# Patient Record
Sex: Male | Born: 1980 | Race: Black or African American | Marital: Married | Smoking: Never smoker
Health system: Southern US, Community
[De-identification: ages and names within clinical notes are randomized; demographics above are authoritative.]

---

## 2016-08-23 ENCOUNTER — Ambulatory Visit
Admission: RE | Admit: 2016-08-23 | Discharge: 2016-08-23 | Disposition: A | Discharge status: Home / Self Care | Payer: No Typology Code available for payment source | Source: Ambulatory Visit | Attending: Occupational Medicine | Admitting: Occupational Medicine

## 2016-08-23 ENCOUNTER — Other Ambulatory Visit: Payer: Self-pay | Admitting: Occupational Medicine

## 2016-08-23 DIAGNOSIS — Z021 Encounter for pre-employment examination: Secondary | ICD-10-CM

## 2017-06-24 IMAGING — CR DG CHEST 1V
1 series · 1 of 1 positions shown · non-contrast
Comparison: None.

CLINICAL DATA: Pre-employment nonsmoker

EXAM:
CHEST 1 VIEW

[w chest pa]
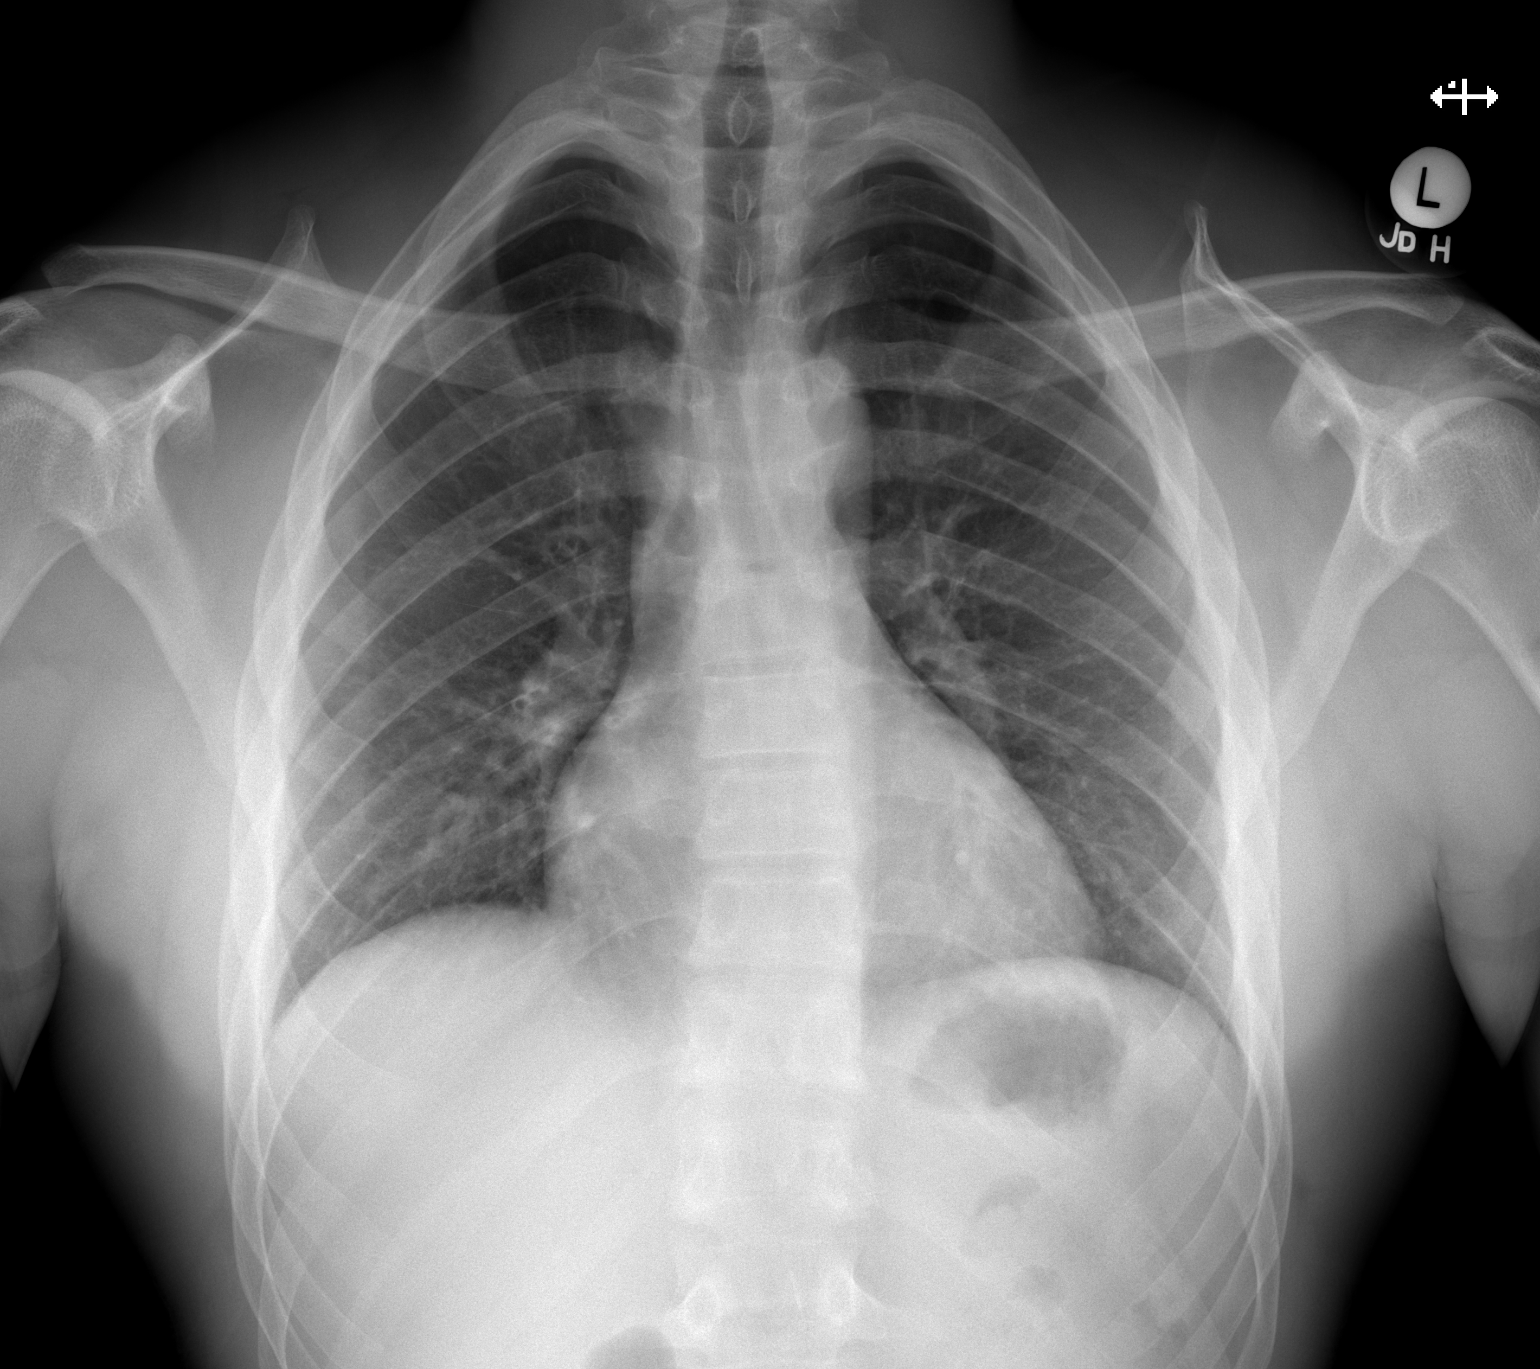

[1 of 1 positions shown; findings below may reference images not displayed]

FINDINGS: The heart size and mediastinal contours are within normal limits.
Both lungs are clear. The visualized skeletal structures are
unremarkable.
IMPRESSION: No active disease.

## 2018-04-10 DIAGNOSIS — R509 Fever, unspecified: Secondary | ICD-10-CM | POA: Diagnosis not present

## 2018-05-19 DIAGNOSIS — M79676 Pain in unspecified toe(s): Secondary | ICD-10-CM | POA: Diagnosis not present

## 2018-05-19 DIAGNOSIS — Z0001 Encounter for general adult medical examination with abnormal findings: Secondary | ICD-10-CM | POA: Diagnosis not present

## 2018-05-19 DIAGNOSIS — Z0000 Encounter for general adult medical examination without abnormal findings: Secondary | ICD-10-CM | POA: Diagnosis not present

## 2018-09-30 ENCOUNTER — Encounter (HOSPITAL_COMMUNITY): Payer: Self-pay | Admitting: Emergency Medicine

## 2018-09-30 ENCOUNTER — Emergency Department (HOSPITAL_COMMUNITY)
Admission: EM | Admit: 2018-09-30 | Discharge: 2018-09-30 | Disposition: A | Discharge status: Home / Self Care | Payer: No Typology Code available for payment source | Attending: Emergency Medicine | Admitting: Emergency Medicine

## 2018-09-30 DIAGNOSIS — Z7721 Contact with and (suspected) exposure to potentially hazardous body fluids: Secondary | ICD-10-CM | POA: Diagnosis present

## 2018-09-30 NOTE — ED Triage Notes (Signed)
Pt works with [employer], was exposed to possible blood/spit/vomit with a CPR that was brought in to our ED.

## 2018-09-30 NOTE — ED Provider Notes (Signed)
[facility] EMERGENCY DEPARTMENT Provider Note   CSN: [id number] Arrival date & time: 09/30/18  0138     History   Chief Complaint Chief Complaint  Patient presents with  . Body Fluid Exposure    HPI Jose Mcdaniel is a 37 y.o. male.   Body Fluid Exposure  Type of exposure:  Body fluid Exposure substance: blood   Exposure location:  Head/neck and face Head/neck exposure location:  Head Facial exposure location:  Face, chin and nose Time since exposure:  2 hours Context:  Patient care Tetanus immunization status:  Up to date Known source: yes   Source HIV status:  Unknown Source hepatitis B status:  Unknown Source hepatitis C status:  Unknown STD concern: no     History reviewed. No pertinent past medical history.  There are no active problems to display for this patient.   History reviewed. No pertinent surgical history.      Home Medications    Prior to Admission medications   Not on File    Family History No family history on file.  Social History Social History   Tobacco Use  . Smoking status: Never Smoker  . Smokeless tobacco: Never Used  Substance Use Topics  . Alcohol use: Yes    Comment: Socially   . Drug use: Not Currently     Allergies   Patient has no known allergies.   Review of Systems Review of Systems  All other systems reviewed and are negative.    Physical Exam Updated Vital Signs BP 132/89 (BP Location: Right Arm)   Pulse (!) 52   Temp 98.4 F (36.9 C) (Oral)   Resp 16   Ht 5\' 11"  (1.803 m)   Wt 86.2 kg   SpO2 95%   BMI 26.50 kg/m   Physical Exam Vitals signs and nursing note reviewed.  Constitutional:      Appearance: He is well-developed.  HENT:     Head: Normocephalic and atraumatic.     Right Ear: Tympanic membrane normal.     Left Ear: Tympanic membrane normal.     Nose: Nose normal.     Mouth/Throat:     Mouth: Mucous membranes are moist.  Eyes:     Extraocular Movements:  Extraocular movements intact.     Pupils: Pupils are equal, round, and reactive to light.  Neck:     Musculoskeletal: Normal range of motion.  Cardiovascular:     Rate and Rhythm: Normal rate.  Pulmonary:     Effort: Pulmonary effort is normal. No respiratory distress.  Abdominal:     General: There is no distension.  Musculoskeletal: Normal range of motion.  Skin:    General: Skin is warm and dry.  Neurological:     General: No focal deficit present.     Mental Status: He is alert.      ED Treatments / Results  Labs (all labs ordered are listed, but only abnormal results are displayed) Labs Reviewed - No data to display  EKG None  Radiology No results found.  Procedures Procedures (including critical care time)  Medications Ordered in ED Medications - No data to display   Initial Impression / Assessment and Plan / ED Course  I have reviewed the triage vital signs and the nursing notes.  Pertinent labs & imaging results that were available during my care of the patient were reviewed by me and considered in my medical decision making (see chart for details).  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 No known medical problems. Labs drawn from other patient. HIV negative. Hepatitis pending. Charge nurse will follow up.   Final Clinical Impressions(s) / ED Diagnoses   Final diagnoses:  None    ED Discharge Orders    None       Aliza Moret, Barbara CowerJason, MD 09/30/18 2132

## 2023-09-09 ENCOUNTER — Ambulatory Visit (HOSPITAL_BASED_OUTPATIENT_CLINIC_OR_DEPARTMENT_OTHER)
Admission: RE | Admit: 2023-09-09 | Discharge: 2023-09-09 | Disposition: A | Discharge status: Home / Self Care | Payer: 59 | Source: Ambulatory Visit | Attending: Internal Medicine | Admitting: Internal Medicine

## 2023-09-09 ENCOUNTER — Encounter (HOSPITAL_BASED_OUTPATIENT_CLINIC_OR_DEPARTMENT_OTHER): Payer: Self-pay | Admitting: Emergency Medicine

## 2023-09-09 ENCOUNTER — Ambulatory Visit (HOSPITAL_BASED_OUTPATIENT_CLINIC_OR_DEPARTMENT_OTHER)
Admission: EM | Admit: 2023-09-09 | Discharge: 2023-09-09 | Disposition: A | Discharge status: Home / Self Care | Payer: 59 | Attending: Internal Medicine | Admitting: Internal Medicine

## 2023-09-09 DIAGNOSIS — R059 Cough, unspecified: Secondary | ICD-10-CM | POA: Insufficient documentation

## 2023-09-09 DIAGNOSIS — J209 Acute bronchitis, unspecified: Secondary | ICD-10-CM | POA: Diagnosis not present

## 2023-09-09 MED ORDER — PREDNISONE 20 MG PO TABS: 40.0000 mg | ORAL_TABLET | Freq: Every day | ORAL | 0 refills | Status: AC

## 2023-09-09 MED ORDER — BENZONATATE 100 MG PO CAPS: 100.0000 mg | ORAL_CAPSULE | Freq: Three times a day (TID) | ORAL | 0 refills | Status: AC

## 2023-09-09 MED ORDER — PROMETHAZINE-DM 6.25-15 MG/5ML PO SYRP: 5.0000 mL | ORAL_SOLUTION | Freq: Every evening | ORAL | 0 refills | Status: AC | PRN

## 2023-09-09 NOTE — ED Triage Notes (Signed)
Pt had cough that is productive since Sunday. Pt taking Mucinex and other OTC meds. Ann Maki being treated for PNA. Reports when lays down cough is worse. Reports that hears a rattle when exhales.

## 2023-09-09 NOTE — ED Provider Notes (Signed)
[facility]    CSN: [id number] Arrival date & time: 09/09/23  0817      History   Chief Complaint Chief Complaint  Patient presents with   Cough    HPI Jose Mcdaniel is a 42 y.o. male.   Jose Mcdaniel is a 42 y.o. male presenting for chief complaint of Cough that started 5 days ago.  Cough is productive with colored sputum and is worse at nighttime.  Reports initial nasal congestion, sore throat, and bodyaches but the symptoms have improved.  Denies shortness of breath reports bilateral chest discomfort associated with cough and states he feels as though there is something in his chest that needs to come out if he were able to cough hard enough. His son was recently treated for atypical pneumonia. Denies nausea, vomiting, diarrhea, abdominal pain, rash, and recent fevers. Denies history of chronic respiratory problems. Never smoker. Taking OTC medications with minimal relief.    Cough   History reviewed. No pertinent past medical history.  There are no problems to display for this patient.   History reviewed. No pertinent surgical history.     Home Medications    Prior to Admission medications   Medication Sig Start Date End Date Taking? Authorizing Provider  benzonatate (TESSALON) 100 MG capsule Take 1 capsule (100 mg total) by mouth every 8 (eight) hours. 09/09/23  Yes Carlisle Beers, FNP  predniSONE (DELTASONE) 20 MG tablet Take 2 tablets (40 mg total) by mouth daily with breakfast for 5 days. 09/09/23 09/14/23 Yes Carlisle Beers, FNP  promethazine-dextromethorphan (PROMETHAZINE-DM) 6.25-15 MG/5ML syrup Take 5 mLs by mouth at bedtime as needed for cough. 09/09/23  Yes Carlisle Beers, FNP    Family History No family history on file.  Social History Social History   Tobacco Use   Smoking status: Never   Smokeless tobacco: Never  Substance Use Topics   Alcohol use: Yes    Comment: Socially    Drug use: Not Currently      Allergies   Patient has no known allergies.   Review of Systems Review of Systems  Respiratory:  Positive for cough.   Per HPI   Physical Exam Triage Vital Signs ED Triage Vitals [09/09/23 0832]  Encounter Vitals Group     BP (!) 132/90     Systolic BP Percentile      Diastolic BP Percentile      Pulse Rate 88     Resp 16     Temp 98.5 F (36.9 C)     Temp Source Oral     SpO2 95 %     Weight      Height      Head Circumference      Peak Flow      Pain Score 0     Pain Loc      Pain Education      Exclude from Growth Chart    No data found.  Updated Vital Signs BP (!) 132/90 (BP Location: Right Arm)   Pulse 88   Temp 98.5 F (36.9 C) (Oral)   Resp 16   SpO2 95%   Visual Acuity Right Eye Distance:   Left Eye Distance:   Bilateral Distance:    Right Eye Near:   Left Eye Near:    Bilateral Near:     Physical Exam Vitals and nursing note reviewed.  Constitutional:      Appearance: He is not ill-appearing or toxic-appearing.  HENT:  Head: Normocephalic and atraumatic.     Right Ear: Hearing, tympanic membrane, ear canal and external ear normal.     Left Ear: Hearing, tympanic membrane, ear canal and external ear normal.     Nose: Nose normal.     Mouth/Throat:     Lips: Pink.     Mouth: Mucous membranes are moist. No injury.     Tongue: No lesions. Tongue does not deviate from midline.     Palate: No mass and lesions.     Pharynx: Oropharynx is clear. Uvula midline. No pharyngeal swelling, oropharyngeal exudate, posterior oropharyngeal erythema or uvula swelling.     Tonsils: No tonsillar exudate or tonsillar abscesses.  Eyes:     General: Lids are normal. Vision grossly intact. Gaze aligned appropriately.     Extraocular Movements: Extraocular movements intact.     Conjunctiva/sclera: Conjunctivae normal.  Cardiovascular:     Rate and Rhythm: Normal rate and regular rhythm.     Heart sounds: Normal heart sounds, S1 normal and S2 normal.   Pulmonary:     Effort: Pulmonary effort is normal. No respiratory distress.     Breath sounds: Normal breath sounds and air entry. No wheezing, rhonchi or rales.     Comments: Course breath sounds throughout.  Chest:     Chest wall: No tenderness.  Musculoskeletal:     Cervical back: Neck supple.     Right lower leg: No edema.     Left lower leg: No edema.  Lymphadenopathy:     Cervical: Cervical adenopathy present.  Skin:    General: Skin is warm and dry.     Capillary Refill: Capillary refill takes less than 2 seconds.     Findings: No rash.  Neurological:     General: No focal deficit present.     Mental Status: He is alert and oriented to person, place, and time. Mental status is at baseline.     Cranial Nerves: No dysarthria or facial asymmetry.  Psychiatric:        Mood and Affect: Mood normal.        Speech: Speech normal.        Behavior: Behavior normal.        Thought Content: Thought content normal.        Judgment: Judgment normal.      UC Treatments / Results  Labs (all labs ordered are listed, but only abnormal results are displayed) Labs Reviewed - No data to display  EKG   Radiology No results found.  Procedures Procedures (including critical care time)  Medications Ordered in UC Medications - No data to display  Initial Impression / Assessment and Plan / UC Course  I have reviewed the triage vital signs and the nursing notes.  Pertinent labs & imaging results that were available during my care of the patient were reviewed by me and considered in my medical decision making (see chart for details).   1. Acute bronchitis Presentation suspicious for acute bronchitis. Will treat with prednisone burst and cough medications for symptomatic relief.  Deferred viral testing based on timing of illness. Chest x-ray ordered outpatient to rule out focal consolidation/pneumonia given recent exposure to sick contact. Staff will call patient if chest x-ray  shows findings that would change treatment plan. Otherwise, patient to follow-up with PCP in 3-5 days as needed.  Counseled patient on potential for adverse effects with medications prescribed/recommended today, strict ER and return-to-clinic precautions discussed, patient verbalized understanding.    Final Clinical  Impressions(s) / UC Diagnoses   Final diagnoses:  Acute bronchitis, unspecified organism     Discharge Instructions      Please go to [facility] for your chest x-ray today.  Their address is: [address] [place]  I will call you with the results of the x-ray if the x-ray shows any abnormalities or need for change in current treatment plan. If the x-ray is negative, you will not hear from me and I would like for you to continue with current treatment plan.  We will go ahead and treat for bronchitis which is inflammation of the upper airways of the lungs caused by a virus.  Take prednisone 40mg  once daily for the next 5 days to reduce inflammation and improve cough. Use cough medicines as needed. Cough syrup (promethazine DM) will make you sleepy, so only take at bedtime.  If you develop any new or worsening symptoms or if your symptoms do not start to improve, please return here or follow-up with your primary care provider. If your symptoms are severe, please go to the emergency room.     ED Prescriptions     Medication Sig Dispense Auth. Provider   predniSONE (DELTASONE) 20 MG tablet Take 2 tablets (40 mg total) by mouth daily with breakfast for 5 days. 10 tablet Carlisle Beers, FNP   promethazine-dextromethorphan (PROMETHAZINE-DM) 6.25-15 MG/5ML syrup Take 5 mLs by mouth at bedtime as needed for cough. 118 mL Reita May M, FNP   benzonatate (TESSALON) 100 MG capsule Take 1 capsule (100 mg total) by mouth every 8 (eight) hours. 21 capsule Carlisle Beers, FNP      PDMP not reviewed this encounter.   Reita May [place], [place] 09/09/23 [phone]

## 2023-09-09 NOTE — Discharge Instructions (Signed)
Please go to [facility] for your chest x-ray today.  Their address is: [address] [facility] [place]  I will call you with the results of the x-ray if the x-ray shows any abnormalities or need for change in current treatment plan. If the x-ray is negative, you will not hear from me and I would like for you to continue with current treatment plan.  We will go ahead and treat for bronchitis which is inflammation of the upper airways of the lungs caused by a virus.  Take prednisone 40mg  once daily for the next 5 days to reduce inflammation and improve cough. Use cough medicines as needed. Cough syrup (promethazine DM) will make you sleepy, so only take at bedtime.  If you develop any new or worsening symptoms or if your symptoms do not start to improve, please return here or follow-up with your primary care provider. If your symptoms are severe, please go to the emergency room.

## 2024-08-07 ENCOUNTER — Other Ambulatory Visit (HOSPITAL_BASED_OUTPATIENT_CLINIC_OR_DEPARTMENT_OTHER): Payer: Self-pay | Admitting: Family Medicine

## 2024-08-07 DIAGNOSIS — Z8249 Family history of ischemic heart disease and other diseases of the circulatory system: Secondary | ICD-10-CM

## 2024-08-20 ENCOUNTER — Ambulatory Visit (HOSPITAL_BASED_OUTPATIENT_CLINIC_OR_DEPARTMENT_OTHER)
Admission: RE | Admit: 2024-08-20 | Discharge: 2024-08-20 | Disposition: A | Discharge status: Home / Self Care | Payer: Self-pay | Source: Ambulatory Visit | Attending: Family Medicine | Admitting: Family Medicine

## 2024-08-20 DIAGNOSIS — Z8249 Family history of ischemic heart disease and other diseases of the circulatory system: Secondary | ICD-10-CM
